# Patient Record
Sex: Male | Born: 2000 | Race: Black or African American | Hispanic: No | Marital: Single | Smoking: Former smoker
Health system: Southern US, Community
[De-identification: ages and names within clinical notes are randomized; demographics above are authoritative.]

---
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
## 2020-03-11 ENCOUNTER — Emergency Department (HOSPITAL_COMMUNITY)
Admission: EM | Admit: 2020-03-11 | Discharge: 2020-03-11 | Disposition: A | Discharge status: Home / Self Care | Payer: Medicaid Other | Attending: Emergency Medicine | Admitting: Emergency Medicine

## 2020-03-11 ENCOUNTER — Other Ambulatory Visit: Payer: Self-pay

## 2020-03-11 DIAGNOSIS — R369 Urethral discharge, unspecified: Secondary | ICD-10-CM | POA: Insufficient documentation

## 2020-03-11 DIAGNOSIS — Z87891 Personal history of nicotine dependence: Secondary | ICD-10-CM | POA: Insufficient documentation

## 2020-03-11 DIAGNOSIS — J45909 Unspecified asthma, uncomplicated: Secondary | ICD-10-CM | POA: Insufficient documentation

## 2020-03-11 LAB — URINALYSIS, ROUTINE W REFLEX MICROSCOPIC
Bilirubin Urine: NEGATIVE
Glucose, UA: NEGATIVE mg/dL
Hgb urine dipstick: NEGATIVE
Ketones, ur: NEGATIVE mg/dL
Nitrite: NEGATIVE
Protein, ur: NEGATIVE mg/dL
Specific Gravity, Urine: 1.026 (ref 1.005–1.030)
WBC, UA: >50 WBC/hpf — ABNORMAL HIGH (ref 0–5)
pH: 6 (ref 5.0–8.0)

## 2020-03-11 MED ORDER — AZITHROMYCIN 1 G PO PACK
1.0000 g | PACK | Freq: Once | ORAL | Status: AC
  Administered 2020-03-11: 1 g via ORAL
  Filled 2020-03-11: qty 1

## 2020-03-11 MED ORDER — STERILE WATER FOR INJECTION IJ SOLN
INTRAMUSCULAR | Status: AC
  Administered 2020-03-11: 10 mL
  Filled 2020-03-11: qty 10

## 2020-03-11 MED ORDER — CEFTRIAXONE SODIUM 500 MG IJ SOLR
500.0000 mg | Freq: Once | INTRAMUSCULAR | Status: AC
  Administered 2020-03-11: 500 mg via INTRAMUSCULAR
  Filled 2020-03-11: qty 500

## 2020-03-11 MED ORDER — DOXYCYCLINE HYCLATE 100 MG PO CAPS: 100.0000 mg | ORAL_CAPSULE | Freq: Two times a day (BID) | ORAL | 0 refills | Status: DC

## 2020-03-11 NOTE — ED Triage Notes (Signed)
Pt noticed clear discharge at the tip of penis & wants eval for STI. Endorses unprotected sex w 1 partner. Also states he feels he's got an ingrown hair that did not heal correctly.

## 2020-03-11 NOTE — ED Provider Notes (Signed)
[facility] EMERGENCY DEPARTMENT Provider Note   CSN: [id number] Arrival date & time: 03/11/20  0357     History Chief Complaint  Patient presents with  . Exposure to STD    Marco Rhodes is a 20 y.o. male.  20 year old male with prior medical history detailed below presents for evaluation of suspected STI.  Patient reports penile drainage noticed 4 days prior.  Patient denies fever.  Patient denies abdominal pain.  Patient denies penile lesion.  He denies prior infection with STI.  He is presenting to the ED with his male partner.  The history is provided by the patient and medical records.  Exposure to STD This is a new problem. The current episode started more than 2 days ago. The problem occurs constantly. The problem has not changed since onset.Pertinent negatives include no chest pain and no abdominal pain. Nothing aggravates the symptoms. Nothing relieves the symptoms.       Past Medical History:  Diagnosis Date  . Asthma     There are no problems to display for this patient.   No past surgical history on file.     No family history on file.  Social History   Tobacco Use  . Smoking status: Former Games developer  . Smokeless tobacco: Never Used  Substance Use Topics  . Drug use: Yes    Types: Marijuana    Comment: Marijuana use daily     Home Medications Prior to Admission medications   Not on File    Allergies    Patient has no known allergies.  Review of Systems   Review of Systems  Cardiovascular: Negative for chest pain.  Gastrointestinal: Negative for abdominal pain.  All other systems reviewed and are negative.   Physical Exam Updated Vital Signs BP 127/88 (BP Location: Left Arm)   Pulse 65   Temp 97.9 F (36.6 C) (Oral)   Resp 17   SpO2 98%   Physical Exam Vitals and nursing note reviewed.  Constitutional:      General: He is not in acute distress.    Appearance: Normal appearance. He is well-developed and  well-nourished.  HENT:     Head: Normocephalic and atraumatic.     Mouth/Throat:     Mouth: Oropharynx is clear and moist.  Eyes:     Extraocular Movements: EOM normal.     Conjunctiva/sclera: Conjunctivae normal.     Pupils: Pupils are equal, round, and reactive to light.  Cardiovascular:     Rate and Rhythm: Normal rate and regular rhythm.     Heart sounds: Normal heart sounds.  Pulmonary:     Effort: Pulmonary effort is normal. No respiratory distress.     Breath sounds: Normal breath sounds.  Abdominal:     General: There is no distension.     Palpations: Abdomen is soft.     Tenderness: There is no abdominal tenderness.  Genitourinary:    Testes: Normal.     Comments: Mild penile discharge noted.  No penile lesions or ulcerations noted. Musculoskeletal:        General: No deformity or edema. Normal range of motion.     Cervical back: Normal range of motion and neck supple.  Skin:    General: Skin is warm and dry.  Neurological:     General: No focal deficit present.     Mental Status: He is alert and oriented to person, place, and time.  Psychiatric:        Mood and Affect:  Mood and affect normal.     ED Results / Procedures / Treatments   Labs (all labs ordered are listed, but only abnormal results are displayed) Labs Reviewed  URINALYSIS, ROUTINE W REFLEX MICROSCOPIC - Abnormal; Notable for the following components:      Result Value   Leukocytes,Ua SMALL (*)    WBC, UA >50 (*)    Bacteria, UA FEW (*)    All other components within normal limits  GC/CHLAMYDIA PROBE AMP ([place]) NOT AT [facility]    EKG None  Radiology No results found.  Procedures Procedures   Medications Ordered in ED Medications  cefTRIAXone (ROCEPHIN) injection 500 mg (has no administration in time range)  azithromycin (ZITHROMAX) powder 1 g (has no administration in time range)    ED Course  I have reviewed the triage vital signs and the nursing notes.  Pertinent labs &  imaging results that were available during my care of the patient were reviewed by me and considered in my medical decision making (see chart for details).    MDM Rules/Calculators/A&P                           MDM  Screen complete  Marco Rhodes was evaluated in Emergency Department on 03/11/2020 for the symptoms described in the history of present illness. He was evaluated in the context of the global COVID-19 pandemic, which necessitated consideration that the patient might be at risk for infection with the SARS-CoV-2 virus that causes COVID-19. Institutional protocols and algorithms that pertain to the evaluation of patients at risk for COVID-19 are in a state of rapid change based on information released by regulatory bodies including the CDC and federal and state organizations. These policies and algorithms were followed during the patient's care in the ED.   Patient is presenting for suspected STI.  Exam is suggestive of urethritis  Patient will be treated empirically.  His partner will also be treated empirically.  Cultures pending.  Patient is advised to closely follow-up with health department or PMD for additional STI screening and evaluation.  Patient is also advised to contact all partners regarding his suspected diagnosis. Final Clinical Impression(s) / ED Diagnoses Final diagnoses:  Penile discharge    Rx / DC Orders ED Discharge Orders         Ordered    doxycycline (VIBRAMYCIN) 100 MG capsule  2 times daily        03/11/20 0833           Wynetta Fines, MD 03/11/20 [phone]

## 2020-03-11 NOTE — Discharge Instructions (Signed)
Please return for any problem.  °

## 2020-03-11 NOTE — ED Triage Notes (Signed)
Pt denies additional signs/symptoms of STI exposure, denies urinary symptoms

## 2020-03-12 LAB — GC/CHLAMYDIA PROBE AMP (~~LOC~~) NOT AT ARMC
Chlamydia: POSITIVE — AB
Comment: NEGATIVE
Comment: NORMAL
Neisseria Gonorrhea: NEGATIVE

## 2020-04-16 ENCOUNTER — Other Ambulatory Visit: Payer: Self-pay

## 2020-04-16 ENCOUNTER — Ambulatory Visit: Payer: Medicaid Other | Attending: Nurse Practitioner | Admitting: Internal Medicine

## 2020-04-16 DIAGNOSIS — Z538 Procedure and treatment not carried out for other reasons: Secondary | ICD-10-CM

## 2020-04-16 NOTE — Progress Notes (Signed)
Patient was scheduled for telephone visit today.  Phone call placed to patient at 3:39 PM.  I got his voicemail.  I did not leave a message.  I tried calling him back about 4 minutes later and no answer.

## 2020-04-21 ENCOUNTER — Inpatient Hospital Stay: Payer: Medicaid Other | Admitting: Nurse Practitioner

## 2020-08-04 ENCOUNTER — Ambulatory Visit: Payer: Self-pay | Admitting: Nurse Practitioner

## 2020-12-02 ENCOUNTER — Emergency Department (HOSPITAL_COMMUNITY): Payer: Medicaid Other

## 2020-12-02 ENCOUNTER — Other Ambulatory Visit: Payer: Self-pay

## 2020-12-02 ENCOUNTER — Emergency Department (HOSPITAL_COMMUNITY)
Admission: EM | Admit: 2020-12-02 | Discharge: 2020-12-02 | Disposition: A | Discharge status: Home / Self Care | Payer: Medicaid Other | Attending: Emergency Medicine | Admitting: Emergency Medicine

## 2020-12-02 ENCOUNTER — Encounter (HOSPITAL_COMMUNITY): Payer: Self-pay | Admitting: Emergency Medicine

## 2020-12-02 ENCOUNTER — Encounter (HOSPITAL_COMMUNITY): Payer: Self-pay

## 2020-12-02 ENCOUNTER — Ambulatory Visit (HOSPITAL_COMMUNITY)
Admission: EM | Admit: 2020-12-02 | Discharge: 2020-12-02 | Disposition: A | Discharge status: Home / Self Care | Payer: Medicaid Other | Attending: Family Medicine | Admitting: Family Medicine

## 2020-12-02 DIAGNOSIS — S42122A Displaced fracture of acromial process, left shoulder, initial encounter for closed fracture: Secondary | ICD-10-CM | POA: Diagnosis not present

## 2020-12-02 DIAGNOSIS — Z23 Encounter for immunization: Secondary | ICD-10-CM | POA: Insufficient documentation

## 2020-12-02 DIAGNOSIS — Y92838 Other recreation area as the place of occurrence of the external cause: Secondary | ICD-10-CM | POA: Insufficient documentation

## 2020-12-02 DIAGNOSIS — S0181XA Laceration without foreign body of other part of head, initial encounter: Secondary | ICD-10-CM | POA: Insufficient documentation

## 2020-12-02 DIAGNOSIS — S42125B Nondisplaced fracture of acromial process, left shoulder, initial encounter for open fracture: Secondary | ICD-10-CM

## 2020-12-02 DIAGNOSIS — Y9302 Activity, running: Secondary | ICD-10-CM | POA: Diagnosis not present

## 2020-12-02 DIAGNOSIS — R000 Tachycardia, unspecified: Secondary | ICD-10-CM | POA: Diagnosis not present

## 2020-12-02 DIAGNOSIS — W3400XA Accidental discharge from unspecified firearms or gun, initial encounter: Secondary | ICD-10-CM | POA: Insufficient documentation

## 2020-12-02 DIAGNOSIS — S4992XA Unspecified injury of left shoulder and upper arm, initial encounter: Secondary | ICD-10-CM | POA: Diagnosis present

## 2020-12-02 DIAGNOSIS — Z87891 Personal history of nicotine dependence: Secondary | ICD-10-CM | POA: Diagnosis not present

## 2020-12-02 DIAGNOSIS — J45909 Unspecified asthma, uncomplicated: Secondary | ICD-10-CM | POA: Insufficient documentation

## 2020-12-02 LAB — COMPREHENSIVE METABOLIC PANEL
ALT: 22 U/L (ref 0–44)
AST: 21 U/L (ref 15–41)
Albumin: 4.9 g/dL (ref 3.5–5.0)
Alkaline Phosphatase: 74 U/L (ref 38–126)
Anion gap: 13 (ref 5–15)
BUN: 9 mg/dL (ref 6–20)
CO2: 24 mmol/L (ref 22–32)
Calcium: 9.8 mg/dL (ref 8.9–10.3)
Chloride: 104 mmol/L (ref 98–111)
Creatinine, Ser: 1.05 mg/dL (ref 0.61–1.24)
GFR, Estimated: >60 mL/min (ref >60)
Glucose, Bld: 124 mg/dL — ABNORMAL HIGH (ref 70–99)
Potassium: 3.6 mmol/L (ref 3.5–5.1)
Sodium: 141 mmol/L (ref 135–145)
Total Bilirubin: 0.6 mg/dL (ref 0.3–1.2)
Total Protein: 8.5 g/dL — ABNORMAL HIGH (ref 6.5–8.1)

## 2020-12-02 LAB — CBC
HCT: 51.7 % (ref 39.0–52.0)
Hemoglobin: 16.7 g/dL (ref 13.0–17.0)
MCH: 28 pg (ref 26.0–34.0)
MCHC: 32.3 g/dL (ref 30.0–36.0)
MCV: 86.6 fL (ref 80.0–100.0)
Platelets: 342 10*3/uL (ref 150–400)
RBC: 5.97 MIL/uL — ABNORMAL HIGH (ref 4.22–5.81)
RDW: 12.9 % (ref 11.5–15.5)
WBC: 11.8 10*3/uL — ABNORMAL HIGH (ref 4.0–10.5)
nRBC: 0 % (ref 0.0–0.2)

## 2020-12-02 LAB — URINALYSIS, ROUTINE W REFLEX MICROSCOPIC
Bilirubin Urine: NEGATIVE
Glucose, UA: NEGATIVE mg/dL
Hgb urine dipstick: NEGATIVE
Ketones, ur: NEGATIVE mg/dL
Leukocytes,Ua: NEGATIVE
Nitrite: NEGATIVE
Protein, ur: NEGATIVE mg/dL
Specific Gravity, Urine: 1.014 (ref 1.005–1.030)
pH: 6 (ref 5.0–8.0)

## 2020-12-02 LAB — LACTIC ACID, PLASMA
Lactic Acid, Venous: 4.6 mmol/L (ref 0.5–1.9)
Lactic Acid, Venous: 2 mmol/L (ref 0.5–1.9)

## 2020-12-02 LAB — I-STAT CHEM 8, ED
BUN: 8 mg/dL (ref 6–20)
Calcium, Ion: 1.15 mmol/L (ref 1.15–1.40)
Chloride: 104 mmol/L (ref 98–111)
Creatinine, Ser: 1.5 mg/dL — ABNORMAL HIGH (ref 0.61–1.24)
Glucose, Bld: 122 mg/dL — ABNORMAL HIGH (ref 70–99)
HCT: 53 % — ABNORMAL HIGH (ref 39.0–52.0)
Hemoglobin: 18 g/dL — ABNORMAL HIGH (ref 13.0–17.0)
Potassium: 3.6 mmol/L (ref 3.5–5.1)
Sodium: 143 mmol/L (ref 135–145)
TCO2: 23 mmol/L (ref 22–32)

## 2020-12-02 LAB — PROTIME-INR
INR: 0.9 (ref 0.8–1.2)
Prothrombin Time: 12.6 seconds (ref 11.4–15.2)

## 2020-12-02 LAB — SAMPLE TO BLOOD BANK

## 2020-12-02 LAB — ETHANOL: Alcohol, Ethyl (B): 232 mg/dL — ABNORMAL HIGH (ref <10)

## 2020-12-02 MED ORDER — BACITRACIN ZINC 500 UNIT/GM EX OINT: 1.0000 "application " | TOPICAL_OINTMENT | Freq: Two times a day (BID) | CUTANEOUS | 0 refills | Status: DC

## 2020-12-02 MED ORDER — HYDROMORPHONE HCL 1 MG/ML IJ SOLN
1.0000 mg | Freq: Once | INTRAMUSCULAR | Status: AC
  Administered 2020-12-02: 1 mg via INTRAVENOUS
  Filled 2020-12-02: qty 1

## 2020-12-02 MED ORDER — CEFAZOLIN SODIUM-DEXTROSE 2-4 GM/100ML-% IV SOLN
2.0000 g | Freq: Once | INTRAVENOUS | Status: AC
  Administered 2020-12-02: 2 g via INTRAVENOUS
  Filled 2020-12-02: qty 100

## 2020-12-02 MED ORDER — TETANUS-DIPHTH-ACELL PERTUSSIS 5-2.5-18.5 LF-MCG/0.5 IM SUSY
0.5000 mL | PREFILLED_SYRINGE | Freq: Once | INTRAMUSCULAR | Status: AC
  Administered 2020-12-02: 0.5 mL via INTRAMUSCULAR
  Filled 2020-12-02: qty 0.5

## 2020-12-02 MED ORDER — SODIUM CHLORIDE 0.9 % IV SOLN: INTRAVENOUS | Status: DC

## 2020-12-02 MED ORDER — LIDOCAINE-EPINEPHRINE (PF) 2 %-1:200000 IJ SOLN
20.0000 mL | Freq: Once | INTRAMUSCULAR | Status: AC
  Administered 2020-12-02: 20 mL
  Filled 2020-12-02: qty 20

## 2020-12-02 MED ORDER — SODIUM CHLORIDE 0.9 % IV BOLUS
1000.0000 mL | Freq: Once | INTRAVENOUS | Status: AC
  Administered 2020-12-02: 1000 mL via INTRAVENOUS

## 2020-12-02 MED ORDER — HYDROCODONE-ACETAMINOPHEN 5-325 MG PO TABS: 1.0000 | ORAL_TABLET | Freq: Four times a day (QID) | ORAL | 0 refills | Status: DC | PRN

## 2020-12-02 MED ORDER — CEPHALEXIN 500 MG PO CAPS: 500.0000 mg | ORAL_CAPSULE | Freq: Four times a day (QID) | ORAL | 0 refills | Status: DC

## 2020-12-02 MED ORDER — IOHEXOL 350 MG/ML SOLN
100.0000 mL | Freq: Once | INTRAVENOUS | Status: AC | PRN
  Administered 2020-12-02: 100 mL via INTRAVENOUS

## 2020-12-02 MED ORDER — POVIDONE-IODINE 5 % EX SOLN
Freq: Once | CUTANEOUS | Status: DC
  Filled 2020-12-02: qty 88.7

## 2020-12-02 MED ORDER — BACITRACIN ZINC 500 UNIT/GM EX OINT
TOPICAL_OINTMENT | Freq: Two times a day (BID) | CUTANEOUS | Status: DC
Start: 2020-12-02 — End: 2020-12-02

## 2020-12-02 NOTE — ED Triage Notes (Signed)
Patient is requesting pain medicine and work note after being discharged from [facility]

## 2020-12-02 NOTE — ED Notes (Signed)
Patient refused COVID swab
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
## 2020-12-02 NOTE — Discharge Instructions (Addendum)

## 2020-12-02 NOTE — Discharge Instructions (Addendum)
Use a topical ointment twice daily.  Take the antibiotics as prescribed.  You should follow-up with your primary doctor or urgent care for suture removal in 1 week. Follow-up with the orthopedic doctor regarding the broken bone in your shoulder. Wear the sling. Return to the ED with new or worsening symptoms

## 2020-12-03 ENCOUNTER — Telehealth (HOSPITAL_COMMUNITY): Payer: Self-pay | Admitting: Physician Assistant

## 2020-12-03 MED ORDER — HYDROCODONE-ACETAMINOPHEN 5-325 MG PO TABS
1.0000 | ORAL_TABLET | Freq: Four times a day (QID) | ORAL | 0 refills | Status: DC | PRN
Start: 2020-12-03 — End: 2021-08-10

## 2020-12-03 NOTE — Telephone Encounter (Signed)
Received call stating pain medication had not been received by pharmacy. Contacted pharmacy to verify. States no record of that patient. Contacted pt to verify RX was not printed with discharge papers. Provider aware to resend rx to pharmacy.
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
## 2020-12-17 ENCOUNTER — Emergency Department (HOSPITAL_COMMUNITY)
Admission: EM | Admit: 2020-12-17 | Discharge: 2020-12-17 | Disposition: A | Discharge status: Home / Self Care | Payer: Medicaid Other | Attending: Emergency Medicine | Admitting: Emergency Medicine

## 2020-12-17 ENCOUNTER — Other Ambulatory Visit: Payer: Self-pay

## 2020-12-17 ENCOUNTER — Encounter (HOSPITAL_COMMUNITY): Payer: Self-pay | Admitting: *Deleted

## 2020-12-17 DIAGNOSIS — Z20822 Contact with and (suspected) exposure to covid-19: Secondary | ICD-10-CM | POA: Diagnosis not present

## 2020-12-17 DIAGNOSIS — Z87891 Personal history of nicotine dependence: Secondary | ICD-10-CM | POA: Insufficient documentation

## 2020-12-17 DIAGNOSIS — J45909 Unspecified asthma, uncomplicated: Secondary | ICD-10-CM | POA: Diagnosis not present

## 2020-12-17 DIAGNOSIS — J029 Acute pharyngitis, unspecified: Secondary | ICD-10-CM | POA: Insufficient documentation

## 2020-12-17 LAB — GROUP A STREP BY PCR: Group A Strep by PCR: NOT DETECTED

## 2020-12-17 LAB — RESP PANEL BY RT-PCR (FLU A&B, COVID) ARPGX2
Influenza A by PCR: NEGATIVE
Influenza B by PCR: NEGATIVE
SARS Coronavirus 2 by RT PCR: NEGATIVE

## 2020-12-17 MED ORDER — DEXAMETHASONE 4 MG PO TABS
10.0000 mg | ORAL_TABLET | Freq: Once | ORAL | Status: AC
  Administered 2020-12-17: 10 mg via ORAL
  Filled 2020-12-17: qty 3

## 2020-12-17 MED ORDER — LIDOCAINE VISCOUS HCL 2 % MT SOLN: 15.0000 mL | OROMUCOSAL | 0 refills | Status: DC | PRN

## 2020-12-17 NOTE — Discharge Instructions (Signed)
If you develop high fever, severe cough or cough with blood, trouble breathing, severe headache, neck pain/stiffness, vomiting, or any other new/concerning symptoms then return to the ER for evaluation  

## 2020-12-17 NOTE — ED Provider Notes (Signed)
[facility] EMERGENCY DEPARTMENT Provider Note   CSN: [id number] Arrival date & time: 12/17/20  1555     History Chief Complaint  Patient presents with   Sore Throat    Marco Rhodes is a 20 y.o. male.  HPI 20 year old male presents with sore throat.  Started 2 days ago.  He has been taking ibuprofen which temporarily relieves the discomfort.  He took some prior to arrival so he actually has no pain right now.  He has not had a fever.  He has a chronic cough that is unchanged.  No shortness of breath, neck pain, vomiting.  He was around a friend who had similar sore throat a few days ago.  He denies difficulty swallowing besides pain but he is able to swallow.  Past Medical History:  Diagnosis Date   Asthma     There are no problems to display for this patient.   History reviewed. No pertinent surgical history.     Family History  Problem Relation Age of Onset   Diabetes Mother     Social History   Tobacco Use   Smoking status: Former    Types: Cigars   Smokeless tobacco: Never  Vaping Use   Vaping Use: Never used  Substance Use Topics   Alcohol use: Never   Drug use: Yes    Types: Marijuana    Comment: Marijuana use daily     Home Medications Prior to Admission medications   Medication Sig Start Date End Date Taking? Authorizing Provider  lidocaine (XYLOCAINE) 2 % solution Use as directed 15 mLs in the mouth or throat every 4 (four) hours as needed for mouth pain. 12/17/20  Yes Pricilla Loveless, MD  bacitracin ointment Apply 1 application topically 2 (two) times daily. 12/02/20   Rancour, Jeannett Senior, MD  cephALEXin (KEFLEX) 500 MG capsule Take 1 capsule (500 mg total) by mouth 4 (four) times daily. 12/02/20   Rancour, Jeannett Senior, MD  doxycycline (VIBRAMYCIN) 100 MG capsule Take 1 capsule (100 mg total) by mouth 2 (two) times daily. Patient not taking: Reported on 04/16/2020 03/11/20   Wynetta Fines, MD  HYDROcodone-acetaminophen (NORCO/VICODIN)  5-325 MG tablet Take 1 tablet by mouth every 6 (six) hours as needed for moderate pain or severe pain. 12/03/20   Raspet, Noberto Retort, PA-C    Allergies    Patient has no known allergies.  Review of Systems   Review of Systems  Constitutional:  Negative for fever.  HENT:  Positive for sore throat.   Respiratory:  Negative for cough and shortness of breath.   Gastrointestinal:  Negative for vomiting.  Musculoskeletal:  Negative for neck pain.  All other systems reviewed and are negative.  Physical Exam Updated Vital Signs BP 133/89 (BP Location: Right Arm)   Pulse 88   Temp 98.6 F (37 C)   Resp 20   Ht 6\' 1"  (1.854 m)   Wt 79.4 kg   SpO2 99%   BMI 23.09 kg/m   Physical Exam Vitals and nursing note reviewed.  Constitutional:      Appearance: He is well-developed.  HENT:     Head: Normocephalic and atraumatic.     Right Ear: External ear normal.     Left Ear: External ear normal.     Nose: Nose normal.     Mouth/Throat:     Tonsils: No tonsillar exudate or tonsillar abscesses. 3+ on the right. 3+ on the left.  Eyes:     General:  Right eye: No discharge.        Left eye: No discharge.  Cardiovascular:     Rate and Rhythm: Normal rate and regular rhythm.     Heart sounds: Normal heart sounds.  Pulmonary:     Effort: Pulmonary effort is normal.     Breath sounds: Normal breath sounds.  Abdominal:     Palpations: Abdomen is soft.     Tenderness: There is no abdominal tenderness.  Musculoskeletal:     Cervical back: Neck supple.  Skin:    General: Skin is warm and dry.  Neurological:     Mental Status: He is alert.  Psychiatric:        Mood and Affect: Mood is not anxious.    ED Results / Procedures / Treatments   Labs (all labs ordered are listed, but only abnormal results are displayed) Labs Reviewed  GROUP A STREP BY PCR  RESP PANEL BY RT-PCR (FLU A&B, COVID) [id number]    EKG None  Radiology No results found.  Procedures Procedures    Medications Ordered in ED Medications  dexamethasone (DECADRON) tablet 10 mg (10 mg Oral Given 12/17/20 1647)    ED Course  I have reviewed the triage vital signs and the nursing notes.  Pertinent labs & imaging results that were available during my care of the patient were reviewed by me and considered in my medical decision making (see chart for details).    MDM Rules/Calculators/A&P                           Patient's COVID and strep and flu testing are all negative.  This is likely a viral pharyngitis.  No indication for antibiotics.  He does not have tonsillar abscess and I have low suspicion for deep space neck infection.  At this point, he was given some Decadron and he appears stable for supportive care as an outpatient.  We discussed return precautions. Final Clinical Impression(s) / ED Diagnoses Final diagnoses:  Viral pharyngitis    Rx / DC Orders ED Discharge Orders          Ordered    lidocaine (XYLOCAINE) 2 % solution  Every 4 hours PRN        12/17/20 1801             Pricilla Loveless, MD 12/17/20 1802

## 2020-12-17 NOTE — ED Triage Notes (Signed)
Pt states sore throat and swollen tonsils x 4 days.  Has been taking up to 1600 mg ibuprofen to help with pain.  Last took ibuprofen at 1530.

## 2021-01-05 ENCOUNTER — Ambulatory Visit: Payer: Self-pay | Admitting: Nurse Practitioner

## 2021-01-14 ENCOUNTER — Ambulatory Visit: Payer: Medicaid Other | Admitting: Family Medicine

## 2021-01-19 ENCOUNTER — Emergency Department (HOSPITAL_COMMUNITY)
Admission: EM | Admit: 2021-01-19 | Discharge: 2021-01-19 | Disposition: A | Discharge status: Home / Self Care | Payer: Medicaid Other | Attending: Emergency Medicine | Admitting: Emergency Medicine

## 2021-01-19 ENCOUNTER — Encounter (HOSPITAL_COMMUNITY): Payer: Self-pay

## 2021-01-19 ENCOUNTER — Other Ambulatory Visit: Payer: Self-pay

## 2021-01-19 ENCOUNTER — Ambulatory Visit (HOSPITAL_COMMUNITY)
Admission: EM | Admit: 2021-01-19 | Discharge: 2021-01-19 | Disposition: A | Discharge status: Home / Self Care | Payer: Medicaid Other | Attending: Physician Assistant | Admitting: Physician Assistant

## 2021-01-19 DIAGNOSIS — Z711 Person with feared health complaint in whom no diagnosis is made: Secondary | ICD-10-CM | POA: Insufficient documentation

## 2021-01-19 DIAGNOSIS — Z5321 Procedure and treatment not carried out due to patient leaving prior to being seen by health care provider: Secondary | ICD-10-CM | POA: Insufficient documentation

## 2021-01-19 DIAGNOSIS — R369 Urethral discharge, unspecified: Secondary | ICD-10-CM | POA: Diagnosis present

## 2021-01-19 DIAGNOSIS — Z113 Encounter for screening for infections with a predominantly sexual mode of transmission: Secondary | ICD-10-CM | POA: Diagnosis not present

## 2021-01-19 LAB — POCT URINALYSIS DIPSTICK, ED / UC
Bilirubin Urine: NEGATIVE
Glucose, UA: NEGATIVE mg/dL
Ketones, ur: 80 mg/dL — AB
Leukocytes,Ua: NEGATIVE
Nitrite: NEGATIVE
Protein, ur: NEGATIVE mg/dL
Specific Gravity, Urine: >=1.03 (ref 1.005–1.030)
Urobilinogen, UA: 0.2 mg/dL (ref 0.0–1.0)
pH: 6 (ref 5.0–8.0)

## 2021-01-19 MED ORDER — LIDOCAINE HCL (PF) 1 % IJ SOLN
INTRAMUSCULAR | Status: AC
  Filled 2021-01-19: qty 2

## 2021-01-19 MED ORDER — CEFTRIAXONE SODIUM 500 MG IJ SOLR
500.0000 mg | Freq: Once | INTRAMUSCULAR | Status: AC
  Administered 2021-01-19: 15:00:00 500 mg via INTRAMUSCULAR

## 2021-01-19 MED ORDER — CEFTRIAXONE SODIUM 500 MG IJ SOLR
INTRAMUSCULAR | Status: AC
  Filled 2021-01-19: qty 500

## 2021-01-19 MED ORDER — DOXYCYCLINE HYCLATE 100 MG PO CAPS: 100.0000 mg | ORAL_CAPSULE | Freq: Two times a day (BID) | ORAL | 0 refills | Status: AC

## 2021-01-19 MED ORDER — DOXYCYCLINE HYCLATE 100 MG PO CAPS: 100.0000 mg | ORAL_CAPSULE | Freq: Two times a day (BID) | ORAL | 0 refills | Status: DC

## 2021-01-19 NOTE — Discharge Instructions (Signed)
I am concerned that you have a sexually transmitted infection.  You were treated for gonorrhea today and I have sent in the treatment for chlamydia (doxycycline 100 mg twice daily for 7 days) to your pharmacy.  You need to abstain from sex for 7 days after completing treatment (minimum of 2 weeks).  All partners need to be tested and treated.  Is important use condoms with each sexual encounter.  If you develop any worsening symptoms including fever, nausea, vomiting, persistent symptoms you need to be seen again.
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
## 2021-01-19 NOTE — ED Triage Notes (Signed)
Pt presents with penile discharge x 2 days.  Presents for STD testing.

## 2021-01-19 NOTE — ED Notes (Signed)
Left before triage

## 2021-01-20 LAB — CYTOLOGY, (ORAL, ANAL, URETHRAL) ANCILLARY ONLY
Chlamydia: POSITIVE — AB
Comment: NEGATIVE
Comment: NEGATIVE
Comment: NORMAL
Neisseria Gonorrhea: NEGATIVE
Trichomonas: POSITIVE — AB

## 2021-01-21 ENCOUNTER — Ambulatory Visit (HOSPITAL_COMMUNITY)
Admission: EM | Admit: 2021-01-21 | Discharge: 2021-01-21 | Disposition: A | Discharge status: Home / Self Care | Payer: Medicaid Other

## 2021-01-21 ENCOUNTER — Telehealth (HOSPITAL_COMMUNITY): Payer: Self-pay | Admitting: Emergency Medicine

## 2021-01-21 ENCOUNTER — Other Ambulatory Visit: Payer: Self-pay

## 2021-01-21 MED ORDER — METRONIDAZOLE 500 MG PO TABS: 2000.0000 mg | ORAL_TABLET | Freq: Once | ORAL | 0 refills | Status: AC

## 2021-01-21 MED ORDER — METRONIDAZOLE 500 MG PO TABS: 2000.0000 mg | ORAL_TABLET | Freq: Once | ORAL | 0 refills | Status: DC

## 2021-01-21 NOTE — Telephone Encounter (Signed)
Patient wanted prescription sent to a different pharmacy 

## 2021-02-15 ENCOUNTER — Ambulatory Visit: Payer: Medicaid Other | Admitting: Family

## 2021-03-17 ENCOUNTER — Ambulatory Visit (HOSPITAL_COMMUNITY)
Admission: RE | Admit: 2021-03-17 | Discharge: 2021-03-17 | Disposition: A | Discharge status: Home / Self Care | Payer: Medicaid Other | Source: Ambulatory Visit | Attending: Emergency Medicine | Admitting: Emergency Medicine

## 2021-03-17 ENCOUNTER — Encounter (HOSPITAL_COMMUNITY): Payer: Self-pay

## 2021-03-17 ENCOUNTER — Other Ambulatory Visit: Payer: Self-pay

## 2021-03-17 VITALS — BP 137/86 | HR 69 | Temp 98.4°F | Resp 16 | Ht 73.0 in | Wt 175.0 lb

## 2021-03-17 DIAGNOSIS — R369 Urethral discharge, unspecified: Secondary | ICD-10-CM | POA: Insufficient documentation

## 2021-03-17 LAB — HIV ANTIBODY (ROUTINE TESTING W REFLEX): HIV Screen 4th Generation wRfx: NONREACTIVE

## 2021-03-17 NOTE — Discharge Instructions (Addendum)
You will get a call if tests are positive, you will not get a call if tests are negative but you can check results in MyChart if you have a MyChart account.   

## 2021-03-17 NOTE — ED Provider Notes (Signed)
[facility]    CSN: [id number] Arrival date & time: 03/17/21  1244      History   Chief Complaint Chief Complaint  Patient presents with   Exposure to STD    HPI Marco Rhodes is a 21 y.o. male. Pt reports clear penile discharge after voiding x 3 days. Reports urethra is irritated. Has had similar sx in the past when had chlamydia. Reports is using condoms but one broke. Wants STI testing.    Exposure to STD Pertinent negatives include no abdominal pain.   Past Medical History:  Diagnosis Date   Asthma     There are no problems to display for this patient.   History reviewed. No pertinent surgical history.     Home Medications    Prior to Admission medications   Medication Sig Start Date End Date Taking? Authorizing Provider  bacitracin ointment Apply 1 application topically 2 (two) times daily. 12/02/20   Rancour, Annie Main, MD  cephALEXin (KEFLEX) 500 MG capsule Take 1 capsule (500 mg total) by mouth 4 (four) times daily. 12/02/20   Rancour, Annie Main, MD  HYDROcodone-acetaminophen (NORCO/VICODIN) 5-325 MG tablet Take 1 tablet by mouth every 6 (six) hours as needed for moderate pain or severe pain. 12/03/20   Raspet, Junie Panning K, PA-C  lidocaine (XYLOCAINE) 2 % solution Use as directed 15 mLs in the mouth or throat every 4 (four) hours as needed for mouth pain. 12/17/20   Sherwood Gambler, MD    Family History Family History  Problem Relation Age of Onset   Diabetes Mother     Social History Social History   Tobacco Use   Smoking status: Former    Types: Cigars   Smokeless tobacco: Never  Scientific laboratory technician Use: Never used  Substance Use Topics   Alcohol use: Never   Drug use: Yes    Types: Marijuana    Comment: Marijuana use daily      Allergies   Patient has no known allergies.   Review of Systems Review of Systems  Constitutional:  Negative for chills and fever.  Gastrointestinal:  Negative for abdominal pain, nausea and vomiting.   Genitourinary:  Positive for penile discharge. Negative for dysuria, flank pain and hematuria.    Physical Exam Triage Vital Signs ED Triage Vitals  Enc Vitals Group     BP 03/17/21 1303 137/86     Pulse Rate 03/17/21 1303 69     Resp 03/17/21 1303 16     Temp 03/17/21 1303 98.4 F (36.9 C)     Temp Source 03/17/21 1303 Oral     SpO2 03/17/21 1302 97 %     Weight 03/17/21 1302 175 lb 0.7 oz (79.4 kg)     Height 03/17/21 1302 6\' 1"  (1.854 m)     Head Circumference --      Peak Flow --      Pain Score 03/17/21 1302 0     Pain Loc --      Pain Edu? --      Excl. in [place]? --    No data found.  Updated Vital Signs BP 137/86 (BP Location: Right Arm)    Pulse 69    Temp 98.4 F (36.9 C) (Oral)    Resp 16    Ht 6\' 1"  (1.854 m)    Wt 175 lb 0.7 oz (79.4 kg)    SpO2 97%    BMI 23.09 kg/m   Visual Acuity Right Eye Distance:   Left  Eye Distance:   Bilateral Distance:    Right Eye Near:   Left Eye Near:    Bilateral Near:     Physical Exam   UC Treatments / Results  Labs (all labs ordered are listed, but only abnormal results are displayed) Labs Reviewed  HIV ANTIBODY (ROUTINE TESTING W REFLEX)  RPR  CYTOLOGY, (ORAL, ANAL, URETHRAL) ANCILLARY ONLY    EKG   Radiology No results found.  Procedures Procedures (including critical care time)  Medications Ordered in UC Medications - No data to display  Initial Impression / Assessment and Plan / UC Course  I have reviewed the triage vital signs and the nursing notes.  Pertinent labs & imaging results that were available during my care of the patient were reviewed by me and considered in my medical decision making (see chart for details).    STI testing results pending. Discussed safe sex practices.   Final Clinical Impressions(s) / UC Diagnoses   Final diagnoses:  Penile discharge     Discharge Instructions      You will get a call if tests are positive, you will not get a call if tests are negative but  you can check results in MyChart if you have a MyChart account.     ED Prescriptions   None    PDMP not reviewed this encounter.   Carvel Getting, NP 03/17/21 1347

## 2021-03-17 NOTE — ED Triage Notes (Signed)
Pt reports penile discharge x 3 days.  Requesting STD testing

## 2021-03-18 LAB — CYTOLOGY, (ORAL, ANAL, URETHRAL) ANCILLARY ONLY
Chlamydia: NEGATIVE
Comment: NEGATIVE
Comment: NEGATIVE
Comment: NORMAL
Neisseria Gonorrhea: NEGATIVE
Trichomonas: POSITIVE — AB

## 2021-03-18 LAB — RPR: RPR Ser Ql: NONREACTIVE

## 2021-03-19 ENCOUNTER — Telehealth (HOSPITAL_COMMUNITY): Payer: Self-pay | Admitting: Emergency Medicine

## 2021-03-19 MED ORDER — METRONIDAZOLE 500 MG PO TABS: 2000.0000 mg | ORAL_TABLET | Freq: Once | ORAL | 0 refills | Status: AC

## 2021-03-20 ENCOUNTER — Encounter: Payer: Self-pay | Admitting: Internal Medicine

## 2021-05-10 NOTE — Progress Notes (Signed)
   Complete physical exam  Patient: Marco Rhodes   DOB: 11/13/1998   21 y.o. Male  MRN: [id number]  Subjective:    No chief complaint on file.   Marco Rhodes is a 21 y.o. male who presents today for a complete physical exam. She reports consuming a {diet types:17450} diet. {types:19826} She generally feels {DESC; WELL/FAIRLY WELL/POORLY:18703}. She reports sleeping {DESC; WELL/FAIRLY WELL/POORLY:18703}. She {does/does not:200015} have additional problems to discuss today.    Most recent fall risk assessment:    07/21/2021   10:42 AM  Fall Risk   Falls in the past year? 0  Number falls in past yr: 0  Injury with Fall? 0  Risk for fall due to : No Fall Risks  Follow up Falls evaluation completed     Most recent depression screenings:    07/21/2021   10:42 AM 06/11/2020   10:46 AM  PHQ 2/9 Scores  PHQ - 2 Score 0 0  PHQ- 9 Score 5     {VISON DENTAL STD PSA (Optional):27386}  {History (Optional):23778}  Patient Care Team: Tocara Mennen, NP as PCP - General (Nurse Practitioner)   Outpatient Medications Prior to Visit  Medication Sig   fluticasone (FLONASE) 50 MCG/ACT nasal spray Place 2 sprays into both nostrils in the morning and at bedtime. After 7 days, reduce to once daily.   norgestimate-ethinyl estradiol (SPRINTEC 28) 0.25-35 MG-MCG tablet Take 1 tablet by mouth daily.   Nystatin POWD Apply liberally to affected area 2 times per day   spironolactone (ALDACTONE) 100 MG tablet Take 1 tablet (100 mg total) by mouth daily.   No facility-administered medications prior to visit.    ROS        Objective:     There were no vitals taken for this visit. {Vitals History (Optional):23777}  Physical Exam   No results found for any visits on 08/26/21. {Show previous labs (optional):23779}    Assessment & Plan:    Routine Health Maintenance and Physical Exam  Immunization History  Administered Date(s) Administered   DTaP 01/27/1999, 03/25/1999,  06/03/1999, 02/17/2000, 09/02/2003   Hepatitis A 06/29/2007, 07/04/2008   Hepatitis B 11/14/1998, 12/22/1998, 06/03/1999   HiB (PRP-OMP) 01/27/1999, 03/25/1999, 06/03/1999, 02/17/2000   IPV 01/27/1999, 03/25/1999, 11/22/1999, 09/02/2003   Influenza,inj,Quad PF,6+ Mos 10/04/2013   Influenza-Unspecified 01/04/2012   MMR 11/21/2000, 09/02/2003   Meningococcal Polysaccharide 07/04/2011   Pneumococcal Conjugate-13 02/17/2000   Pneumococcal-Unspecified 06/03/1999, 08/17/1999   Tdap 07/04/2011   Varicella 11/22/1999, 06/29/2007    Health Maintenance  Topic Date Due   HIV Screening  Never done   Hepatitis C Screening  Never done   INFLUENZA VACCINE  08/24/2021   PAP-Cervical Cytology Screening  08/26/2021 (Originally 11/13/2019)   PAP SMEAR-Modifier  08/26/2021 (Originally 11/13/2019)   TETANUS/TDAP  08/26/2021 (Originally 07/03/2021)   HPV VACCINES  Discontinued   COVID-19 Vaccine  Discontinued    Discussed health benefits of physical activity, and encouraged her to engage in regular exercise appropriate for her age and condition.  Problem List Items Addressed This Visit   None Visit Diagnoses     Annual physical exam    -  Primary   Cervical cancer screening       Need for Tdap vaccination          No follow-ups on file.     Caitlinn Klinker, NP   

## 2021-05-11 ENCOUNTER — Ambulatory Visit (INDEPENDENT_AMBULATORY_CARE_PROVIDER_SITE_OTHER): Payer: Medicaid Other | Admitting: Medical-Surgical

## 2021-05-11 DIAGNOSIS — Z7689 Persons encountering health services in other specified circumstances: Secondary | ICD-10-CM

## 2021-05-11 DIAGNOSIS — Z91199 Patient's noncompliance with other medical treatment and regimen due to unspecified reason: Secondary | ICD-10-CM

## 2021-08-09 ENCOUNTER — Ambulatory Visit: Payer: Medicaid Other | Admitting: Family Medicine

## 2021-08-10 ENCOUNTER — Ambulatory Visit (HOSPITAL_COMMUNITY)
Admission: EM | Admit: 2021-08-10 | Discharge: 2021-08-10 | Disposition: A | Discharge status: Home / Self Care | Payer: Medicaid Other | Attending: Internal Medicine | Admitting: Internal Medicine

## 2021-08-10 ENCOUNTER — Encounter (HOSPITAL_COMMUNITY): Payer: Self-pay | Admitting: Emergency Medicine

## 2021-08-10 DIAGNOSIS — Z113 Encounter for screening for infections with a predominantly sexual mode of transmission: Secondary | ICD-10-CM | POA: Insufficient documentation

## 2021-08-10 NOTE — Discharge Instructions (Signed)
Your STI testing is pending.  Do not participate in sexual intercourse until your STI testing is back.  If your testing is positive, avoid intercourse for 1 week during treatment.  You will receive a phone call if any of your results are positive and treatment at that time.

## 2021-08-10 NOTE — ED Provider Notes (Signed)
[facility]    CSN: [id number] Arrival date & time: 08/10/21  0857      History   Chief Complaint Chief Complaint  Patient presents with   STD screening     HPI Marco Rhodes is a 21 y.o. male.   Patient presents to urgent care for routine STI screening.  Patient states that he is single and has not had any recent new sexual partners or known exposures to STIs and simply would like to get checked.  He has been treated for chlamydia in the past but this was approximately 1 year ago.  He is asymptomatic at this time and denies any penile discharge, penile rash, urinary frequency, urgency, dysuria, hesitancy, fever/chills, nausea, vomiting, and dizziness.  Patient states that he recently had HIV and RPR testing that was nonreactive and declines offer for repeat testing today.     Past Medical History:  Diagnosis Date   Asthma     There are no problems to display for this patient.   History reviewed. No pertinent surgical history.     Home Medications    Prior to Admission medications   Not on File    Family History Family History  Problem Relation Age of Onset   Diabetes Mother     Social History Social History   Tobacco Use   Smoking status: Former    Types: Cigars   Smokeless tobacco: Never  Vaping Use   Vaping Use: Never used  Substance Use Topics   Alcohol use: Never   Drug use: Yes    Types: Marijuana    Comment: Marijuana use daily      Allergies   Patient has no known allergies.   Review of Systems Review of Systems Per HPI  Physical Exam Triage Vital Signs ED Triage Vitals  Enc Vitals Group     BP 08/10/21 1022 (!) 151/86     Pulse Rate 08/10/21 1022 66     Resp 08/10/21 1022 16     Temp 08/10/21 1022 97.9 F (36.6 C)     Temp Source 08/10/21 1022 Oral     SpO2 08/10/21 1022 98 %     Weight --      Height --      Head Circumference --      Peak Flow --      Pain Score 08/10/21 1025 0     Pain Loc --      Pain  Edu? --      Excl. in GC? --    No data found.  Updated Vital Signs BP (!) 151/86 (BP Location: Left Arm)   Pulse 66   Temp 97.9 F (36.6 C) (Oral)   Resp 16   SpO2 98%   Visual Acuity Right Eye Distance:   Left Eye Distance:   Bilateral Distance:    Right Eye Near:   Left Eye Near:    Bilateral Near:     Physical Exam Vitals and nursing note reviewed.  Constitutional:      Appearance: Normal appearance. He is not ill-appearing or toxic-appearing.     Comments: Very pleasant patient sitting on exam in position of comfort table in no acute distress.   HENT:     Head: Normocephalic and atraumatic.     Right Ear: Hearing and external ear normal.     Left Ear: Hearing and external ear normal.     Nose: Nose normal.     Mouth/Throat:     Lips:  Pink.     Mouth: Mucous membranes are moist.  Eyes:     General: Lids are normal. Vision grossly intact. Gaze aligned appropriately.     Extraocular Movements: Extraocular movements intact.     Conjunctiva/sclera: Conjunctivae normal.  Pulmonary:     Effort: Pulmonary effort is normal.  Abdominal:     Palpations: Abdomen is soft.  Musculoskeletal:     Cervical back: Neck supple.  Skin:    General: Skin is warm and dry.     Capillary Refill: Capillary refill takes less than 2 seconds.     Findings: No rash.  Neurological:     General: No focal deficit present.     Mental Status: He is alert and oriented to person, place, and time. Mental status is at baseline.     Cranial Nerves: No dysarthria or facial asymmetry.     Gait: Gait is intact.  Psychiatric:        Mood and Affect: Mood normal.        Speech: Speech normal.        Behavior: Behavior normal.        Thought Content: Thought content normal.        Judgment: Judgment normal.      UC Treatments / Results  Labs (all labs ordered are listed, but only abnormal results are displayed) Labs Reviewed  CYTOLOGY, (ORAL, ANAL, URETHRAL) ANCILLARY ONLY     EKG   Radiology No results found.  Procedures Procedures (including critical care time)  Medications Ordered in UC Medications - No data to display  Initial Impression / Assessment and Plan / UC Course  I have reviewed the triage vital signs and the nursing notes.  Pertinent labs & imaging results that were available during my care of the patient were reviewed by me and considered in my medical decision making (see chart for details).   1.  Encounter for screening examination of sexually transmitted illnesses STI labs pending.  Patient declines HIV and syphilis testing today.  Will notify patient of positive results and treat accordingly when labs come back.  Patient to avoid sexual intercourse until screening testing comes back.  Education provided regarding safe sexual practices and patient encouraged to use protection to prevent spread of STIs.   ED return precautions given.  Patient may follow-up with urgent care as needed.  Final Clinical Impressions(s) / UC Diagnoses   Final diagnoses:  Screening for STD (sexually transmitted disease)     Discharge Instructions      Your STI testing is pending.  Do not participate in sexual intercourse until your STI testing is back.  If your testing is positive, avoid intercourse for 1 week during treatment.  You will receive a phone call if any of your results are positive and treatment at that time.       ED Prescriptions   None    PDMP not reviewed this encounter.   Carlisle Beers, [place] 08/10/21 1141

## 2021-08-10 NOTE — ED Triage Notes (Signed)
Patient presents for STD screening today.   Patient denies any symptoms. Patient denies any known exposures.

## 2021-08-11 LAB — CYTOLOGY, (ORAL, ANAL, URETHRAL) ANCILLARY ONLY
Chlamydia: NEGATIVE
Comment: NEGATIVE
Comment: NEGATIVE
Comment: NORMAL
Neisseria Gonorrhea: NEGATIVE
Trichomonas: NEGATIVE

## 2021-08-26 ENCOUNTER — Encounter: Payer: Self-pay | Admitting: Medical-Surgical

## 2021-09-02 NOTE — Telephone Encounter (Signed)
LVM for patient to call back to get appointment scheduled. AMUCK

## 2021-09-04 ENCOUNTER — Ambulatory Visit (HOSPITAL_COMMUNITY): Payer: Medicaid Other

## 2021-09-08 ENCOUNTER — Ambulatory Visit (HOSPITAL_COMMUNITY): Payer: Medicaid Other

## 2021-09-20 ENCOUNTER — Ambulatory Visit: Payer: Medicaid Other

## 2021-09-23 ENCOUNTER — Ambulatory Visit (HOSPITAL_COMMUNITY): Payer: Medicaid Other

## 2021-09-30 ENCOUNTER — Encounter (HOSPITAL_COMMUNITY): Payer: Self-pay

## 2021-09-30 ENCOUNTER — Ambulatory Visit (HOSPITAL_COMMUNITY)
Admission: RE | Admit: 2021-09-30 | Discharge: 2021-09-30 | Disposition: A | Discharge status: Home / Self Care | Payer: Medicaid Other | Source: Ambulatory Visit | Attending: Emergency Medicine | Admitting: Emergency Medicine

## 2021-09-30 VITALS — BP 130/83 | HR 66 | Temp 98.4°F | Resp 14

## 2021-09-30 DIAGNOSIS — Z113 Encounter for screening for infections with a predominantly sexual mode of transmission: Secondary | ICD-10-CM | POA: Insufficient documentation

## 2021-09-30 DIAGNOSIS — R369 Urethral discharge, unspecified: Secondary | ICD-10-CM | POA: Diagnosis present

## 2021-09-30 NOTE — Discharge Instructions (Addendum)
Swab sent out to test again for STIs. If tests are again negative but you continue to have discharge recommend follow-up with a urologist for further evaluation.   [facility] [address] [place],  [place]  [address] [phone]

## 2021-09-30 NOTE — ED Triage Notes (Signed)
Pt reports was seen here in July for penile discharge and all STD test came back negative. Reports still having discharge and wants to be retested.

## 2021-09-30 NOTE — ED Provider Notes (Signed)
[facility]    CSN: [id number] Arrival date & time: 09/30/21  1543      History   Chief Complaint Chief Complaint  Patient presents with   Exposure to STD    Entered by patient    HPI Tanush Drees is a 21 y.o. male.   Patient presents requesting STI testing. He was seen here in July and had testing that all returned negative. He states since then he has been having persistent urethral discharge. He states it is grey in color. He denies any pain or discomfort including dysuria or abdominal or back pain. He denies any rash or visible skin irritation. The patient reports he has not had intercourse since he was seen and tested in July. He requests to get retested due to his ongoing discharge.   The history is provided by the patient.  Exposure to STD Pertinent negatives include no abdominal pain.    Past Medical History:  Diagnosis Date   Asthma     There are no problems to display for this patient.   History reviewed. No pertinent surgical history.     Home Medications    Prior to Admission medications   Not on File    Family History Family History  Problem Relation Age of Onset   Diabetes Mother     Social History Social History   Tobacco Use   Smoking status: Former    Types: Cigars   Smokeless tobacco: Never  Vaping Use   Vaping Use: Never used  Substance Use Topics   Alcohol use: Never   Drug use: Yes    Types: Marijuana    Comment: Marijuana use daily      Allergies   Patient has no known allergies.   Review of Systems Review of Systems  Constitutional:  Negative for fever.  Gastrointestinal:  Negative for abdominal pain, nausea and vomiting.  Genitourinary:  Positive for penile discharge. Negative for dysuria, flank pain, frequency, genital sores, hematuria, penile pain, penile swelling, scrotal swelling, testicular pain and urgency.  Musculoskeletal:  Negative for back pain.  Skin:  Negative for rash.     Physical  Exam Triage Vital Signs ED Triage Vitals [09/30/21 1554]  Enc Vitals Group     BP 130/83     Pulse Rate 66     Resp 14     Temp 98.4 F (36.9 C)     Temp Source Oral     SpO2 97 %     Weight      Height      Head Circumference      Peak Flow      Pain Score 0     Pain Loc      Pain Edu?      Excl. in GC?    No data found.  Updated Vital Signs BP 130/83 (BP Location: Right Arm)   Pulse 66   Temp 98.4 F (36.9 C) (Oral)   Resp 14   SpO2 97%   Visual Acuity Right Eye Distance:   Left Eye Distance:   Bilateral Distance:    Right Eye Near:   Left Eye Near:    Bilateral Near:     Physical Exam Vitals and nursing note reviewed.  Constitutional:      General: He is not in acute distress. Cardiovascular:     Rate and Rhythm: Normal rate and regular rhythm.     Heart sounds: Normal heart sounds.  Pulmonary:     Effort:  Pulmonary effort is normal.     Breath sounds: Normal breath sounds.  Abdominal:     Palpations: Abdomen is soft.     Tenderness: There is no abdominal tenderness. There is no right CVA tenderness, left CVA tenderness or guarding.  Genitourinary:    Comments: Pt declined Neurological:     Mental Status: He is alert.  Psychiatric:        Mood and Affect: Mood normal.      UC Treatments / Results  Labs (all labs ordered are listed, but only abnormal results are displayed) Labs Reviewed  CYTOLOGY, (ORAL, ANAL, URETHRAL) ANCILLARY ONLY    EKG   Radiology No results found.  Procedures Procedures (including critical care time)  Medications Ordered in UC Medications - No data to display  Initial Impression / Assessment and Plan / UC Course  I have reviewed the triage vital signs and the nursing notes.  Pertinent labs & imaging results that were available during my care of the patient were reviewed by me and considered in my medical decision making (see chart for details).     Retest GCT. Discussed w pt recommendation to see urology  for further evaluation if negative testing and continue to have discharge. Due to sx persistent since last negative test, pt reports no sexual intercourse since last test, and no dysuria will hold off on any empiric tx until results return.   E/M: 1 acute uncomplicated illness, 1 data (GCT), low risk   Final Clinical Impressions(s) / UC Diagnoses   Final diagnoses:  Penile discharge  Screen for STD (sexually transmitted disease)     Discharge Instructions      Swab sent out to test again for STIs. If tests are again negative but you continue to have discharge recommend follow-up with a urologist for further evaluation.   [facility] [address] [place],  [place]  [address] [phone]    ED Prescriptions   None    PDMP not reviewed this encounter.   Estanislado Pandy, [place] 09/30/21 1614

## 2021-10-01 ENCOUNTER — Telehealth (HOSPITAL_COMMUNITY): Payer: Self-pay | Admitting: Emergency Medicine

## 2021-10-01 LAB — CYTOLOGY, (ORAL, ANAL, URETHRAL) ANCILLARY ONLY
Chlamydia: POSITIVE — AB
Comment: NEGATIVE
Comment: NEGATIVE
Comment: NORMAL
Neisseria Gonorrhea: NEGATIVE
Trichomonas: NEGATIVE

## 2021-10-01 MED ORDER — DOXYCYCLINE HYCLATE 100 MG PO CAPS: 100.0000 mg | ORAL_CAPSULE | Freq: Two times a day (BID) | ORAL | 0 refills | Status: AC

## 2022-01-15 ENCOUNTER — Encounter (HOSPITAL_COMMUNITY): Payer: Self-pay | Admitting: Emergency Medicine

## 2022-01-15 ENCOUNTER — Other Ambulatory Visit: Payer: Self-pay

## 2022-01-15 ENCOUNTER — Emergency Department (HOSPITAL_COMMUNITY)
Admission: EM | Admit: 2022-01-15 | Discharge: 2022-01-15 | Discharge status: Against Medical Advice | Payer: Medicaid Other | Attending: Emergency Medicine | Admitting: Emergency Medicine

## 2022-01-15 ENCOUNTER — Ambulatory Visit (HOSPITAL_COMMUNITY)
Admission: EM | Admit: 2022-01-15 | Discharge: 2022-01-15 | Disposition: A | Discharge status: Home / Self Care | Payer: Medicaid Other | Attending: Internal Medicine | Admitting: Internal Medicine

## 2022-01-15 ENCOUNTER — Emergency Department (HOSPITAL_COMMUNITY): Payer: Medicaid Other

## 2022-01-15 DIAGNOSIS — S51811A Laceration without foreign body of right forearm, initial encounter: Secondary | ICD-10-CM

## 2022-01-15 DIAGNOSIS — S61210A Laceration without foreign body of right index finger without damage to nail, initial encounter: Secondary | ICD-10-CM | POA: Diagnosis not present

## 2022-01-15 DIAGNOSIS — W268XXA Contact with other sharp object(s), not elsewhere classified, initial encounter: Secondary | ICD-10-CM | POA: Insufficient documentation

## 2022-01-15 DIAGNOSIS — S61411A Laceration without foreign body of right hand, initial encounter: Secondary | ICD-10-CM

## 2022-01-15 DIAGNOSIS — S41111A Laceration without foreign body of right upper arm, initial encounter: Secondary | ICD-10-CM | POA: Insufficient documentation

## 2022-01-15 DIAGNOSIS — Z5321 Procedure and treatment not carried out due to patient leaving prior to being seen by health care provider: Secondary | ICD-10-CM | POA: Insufficient documentation

## 2022-01-15 MED ORDER — IBUPROFEN 800 MG PO TABS
ORAL_TABLET | ORAL | Status: AC
  Filled 2022-01-15: qty 1

## 2022-01-15 MED ORDER — LIDOCAINE-EPINEPHRINE-TETRACAINE (LET) TOPICAL GEL
TOPICAL | Status: AC
  Filled 2022-01-15: qty 6

## 2022-01-15 MED ORDER — LIDOCAINE HCL (PF) 2 % IJ SOLN
INTRAMUSCULAR | Status: AC
  Filled 2022-01-15: qty 5

## 2022-01-15 MED ORDER — IBUPROFEN 800 MG PO TABS
800.0000 mg | ORAL_TABLET | Freq: Once | ORAL | Status: AC
  Administered 2022-01-15: 800 mg via ORAL

## 2022-01-15 MED ORDER — LIDOCAINE-EPINEPHRINE 1 %-1:100000 IJ SOLN
INTRAMUSCULAR | Status: AC
  Filled 2022-01-15: qty 1

## 2022-01-15 NOTE — ED Notes (Signed)
Pt stated he is leaving and going to urgent care

## 2022-01-15 NOTE — ED Provider Triage Note (Addendum)
Emergency Medicine Provider Triage Evaluation Note  Marco Rhodes , a 21 y.o. male  was evaluated in triage.  Pt complains of laceration to right arm.  Patient notes "I got gashed up.".  When asked questions about how the accident happened and when it had been patient responds "it is obvious that happened before I got here".  Patient not forthcoming about answers regarding tetanus status or any additional findings.  Patient only noting "I cannot feel my hand".  Patient is laying in a contorted position in triage.  Review of Systems  Positive:  Negative:   Physical Exam  BP 112/77 (BP Location: Left Arm)   Pulse (!) 104   Temp 97.8 F (36.6 C) (Oral)   Resp 19   SpO2 98%  Gen:   Awake, no distress   Resp:  Normal effort  MSK:   Moves extremities without difficulty  Other:  Attempted to locate patient's laceration, however, unable to assess patient's laceration due to patient not allowing myself, tech, RN to look at the laceration.    Medical Decision Making  Medically screening exam initiated at 8:21 AM.  Appropriate orders placed.  Anastasios Melander was informed that the remainder of the evaluation will be completed by another provider, this initial triage assessment does not replace that evaluation, and the importance of remaining in the ED until their evaluation is complete.  Xray ordered in light of patient not letting us review the laceration.   Press Casale A, PA-C 01/15/22 0822    Contrina Orona A, PA-C 01/15/22 [phone]

## 2022-01-15 NOTE — Discharge Instructions (Signed)
Wound care: Please keep the area surrounding the wound/sutures clean and dry for the next 24 hours. After 24 hours, you may get the wound wet. Gently clean wound with antibacterial soap. Do not scrub wound. Cover the area with a nonstick bandage and change the bandage 2 times a day.   You should have the sutures removed in 7-10 days by your primary care provider or at urgent care. Return sooner than 7-10 days if you experience discharge from your laceration, redness around your laceration, warmth around your laceration, or fever.   We recommend you take 600mg ibuprofen every 6 hours or tylenol 650mg every 6 hours as needed for pain. If needed, you can alternate these medications so that you take one medication every 3 hours. For instance, at noon take ibuprofen, then at 3pm take tylenol, then at 6pm take ibuprofen.  
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
## 2022-01-15 NOTE — ED Triage Notes (Signed)
Patient accidentally cut his R lower arm. Patient very verbally aggressive and not allowing this RN or the PA to assess the laceration. States that it happened accidentally. Unsure of tetanus.
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
## 2022-07-13 ENCOUNTER — Other Ambulatory Visit: Payer: Self-pay

## 2022-07-13 ENCOUNTER — Encounter (HOSPITAL_COMMUNITY): Payer: Self-pay

## 2022-07-13 ENCOUNTER — Ambulatory Visit (HOSPITAL_COMMUNITY)
Admission: RE | Admit: 2022-07-13 | Discharge: 2022-07-13 | Disposition: A | Discharge status: Home / Self Care | Payer: Medicaid Other | Source: Ambulatory Visit | Attending: Family Medicine | Admitting: Family Medicine

## 2022-07-13 VITALS — BP 129/83 | HR 90 | Temp 98.0°F | Resp 16 | Ht 73.0 in | Wt 176.4 lb

## 2022-07-13 DIAGNOSIS — N342 Other urethritis: Secondary | ICD-10-CM | POA: Diagnosis present

## 2022-07-13 MED ORDER — DOXYCYCLINE HYCLATE 100 MG PO CAPS: 100.0000 mg | ORAL_CAPSULE | Freq: Two times a day (BID) | ORAL | 0 refills | Status: AC

## 2022-07-13 MED ORDER — CEFTRIAXONE SODIUM 500 MG IJ SOLR
500.0000 mg | INTRAMUSCULAR | Status: DC
  Administered 2022-07-13: 500 mg via INTRAMUSCULAR

## 2022-07-13 MED ORDER — CEFTRIAXONE SODIUM 500 MG IJ SOLR
INTRAMUSCULAR | Status: AC
  Filled 2022-07-13: qty 500

## 2022-07-13 MED ORDER — LIDOCAINE HCL (PF) 1 % IJ SOLN
INTRAMUSCULAR | Status: AC
  Filled 2022-07-13: qty 2

## 2022-07-13 NOTE — ED Triage Notes (Signed)
Pt requesting to be check for STD. 

## 2022-07-13 NOTE — ED Provider Notes (Signed)
[facility]    CSN: [id number] Arrival date & time: 07/13/22  1846      History   Chief Complaint Chief Complaint  Patient presents with   SEXUALLY TRANSMITTED DISEASE    Unprotected sex - Entered by patient    HPI Marco Rhodes is a 22 y.o. male.   HPI Here for testing for sexually transmitted diseases.  He then relates that he has had unprotected sex and has had some penile discharge.  He is unable to tell me for how long.  No fever and no vomiting.  No allergies to medications    Past Medical History:  Diagnosis Date   Asthma     There are no problems to display for this patient.   History reviewed. No pertinent surgical history.     Home Medications    Prior to Admission medications   Medication Sig Start Date End Date Taking? Authorizing Provider  doxycycline (VIBRAMYCIN) 100 MG capsule Take 1 capsule (100 mg total) by mouth 2 (two) times daily for 7 days. 07/13/22 07/20/22 Yes Mylene Bow, Janace Aris, MD    Family History Family History  Problem Relation Age of Onset   Diabetes Mother     Social History Social History   Tobacco Use   Smoking status: Former    Types: Cigars   Smokeless tobacco: Never  Vaping Use   Vaping Use: Never used  Substance Use Topics   Alcohol use: Never   Drug use: Yes    Types: Marijuana    Comment: Marijuana use daily      Allergies   Patient has no known allergies.   Review of Systems Review of Systems   Physical Exam Triage Vital Signs ED Triage Vitals [07/13/22 1855]  Enc Vitals Group     BP 129/83     Pulse Rate 90     Resp 16     Temp 98 F (36.7 C)     Temp Source Oral     SpO2 100 %     Weight 176 lb 5.9 oz (80 kg)     Height 6\' 1"  (1.854 m)     Head Circumference      Peak Flow      Pain Score 0     Pain Loc      Pain Edu?      Excl. in GC?    No data found.  Updated Vital Signs BP 129/83 (BP Location: Right Arm)   Pulse 90   Temp 98 F (36.7 C) (Oral)   Resp 16   Ht  6\' 1"  (1.854 m)   Wt 80 kg   SpO2 100%   BMI 23.27 kg/m   Visual Acuity Right Eye Distance:   Left Eye Distance:   Bilateral Distance:    Right Eye Near:   Left Eye Near:    Bilateral Near:     Physical Exam Vitals reviewed.  Constitutional:      General: He is not in acute distress.    Appearance: He is not ill-appearing, toxic-appearing or diaphoretic.  Skin:    Coloration: Skin is not pale.  Neurological:     Mental Status: He is alert and oriented to person, place, and time.  Psychiatric:        Behavior: Behavior normal.      UC Treatments / Results  Labs (all labs ordered are listed, but only abnormal results are displayed) Labs Reviewed  CYTOLOGY, (ORAL, ANAL, URETHRAL) ANCILLARY ONLY  EKG   Radiology No results found.  Procedures Procedures (including critical care time)  Medications Ordered in UC Medications  cefTRIAXone (ROCEPHIN) injection 500 mg (has no administration in time range)    Initial Impression / Assessment and Plan / UC Course  I have reviewed the triage vital signs and the nursing notes.  Pertinent labs & imaging results that were available during my care of the patient were reviewed by me and considered in my medical decision making (see chart for details).        Urethral self swab is done tonight.  We will notify him of any positives.  He wants to be treated empirically for gonorrhea and chlamydia.  Ceftriaxone was given here IM and doxycycline is sent to the pharmacy.  Trichomonas is positive on the test, we will treat per protocol.  He declines my offer of HIV and RPR screening tonight. Final Clinical Impressions(s) / UC Diagnoses   Final diagnoses:  Urethritis     Discharge Instructions      Staff will notify you if anything is positive on your swab.  You have been given a shot of ceftriaxone 500 mg; this is before potential gonorrhea infection  Take doxycycline 100 mg --1 capsule 2 times daily for 7 days;  this is for potential chlamydia infection      ED Prescriptions     Medication Sig Dispense Auth. Provider   doxycycline (VIBRAMYCIN) 100 MG capsule Take 1 capsule (100 mg total) by mouth 2 (two) times daily for 7 days. 14 capsule Marlinda Mike, Janace Aris, MD      PDMP not reviewed this encounter.   Zenia Resides, MD 07/13/22 [phone]

## 2022-07-13 NOTE — Discharge Instructions (Signed)
Staff will notify you if anything is positive on your swab.  You have been given a shot of ceftriaxone 500 mg; this is before potential gonorrhea infection  Take doxycycline 100 mg --1 capsule 2 times daily for 7 days; this is for potential chlamydia infection

## 2022-07-15 ENCOUNTER — Telehealth (HOSPITAL_COMMUNITY): Payer: Self-pay | Admitting: Emergency Medicine

## 2022-07-15 LAB — CYTOLOGY, (ORAL, ANAL, URETHRAL) ANCILLARY ONLY
Chlamydia: POSITIVE — AB
Comment: NEGATIVE
Comment: NEGATIVE
Comment: NORMAL
Neisseria Gonorrhea: NEGATIVE
Trichomonas: POSITIVE — AB

## 2022-07-15 MED ORDER — METRONIDAZOLE 500 MG PO TABS: 2000.0000 mg | ORAL_TABLET | Freq: Once | ORAL | 0 refills | Status: AC
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
## 2022-08-12 ENCOUNTER — Ambulatory Visit (HOSPITAL_COMMUNITY): Payer: Medicaid Other

## 2022-08-20 ENCOUNTER — Encounter (HOSPITAL_COMMUNITY): Payer: Self-pay

## 2022-08-20 ENCOUNTER — Ambulatory Visit (HOSPITAL_COMMUNITY): Payer: Medicaid Other

## 2022-08-20 VITALS — BP 144/82 | HR 88 | Temp 98.4°F | Resp 16

## 2022-08-20 DIAGNOSIS — Z113 Encounter for screening for infections with a predominantly sexual mode of transmission: Secondary | ICD-10-CM | POA: Diagnosis present

## 2022-08-20 NOTE — Discharge Instructions (Signed)
If your results turn out positive, you will be contacted and treated at that time.  You need to avoid sexual activity while you are waiting on your results.  Always use protection.

## 2022-08-20 NOTE — ED Triage Notes (Signed)
Patient is requesting STD testing and denies having any symptoms,. "I'm just trying to be sure."

## 2022-08-20 NOTE — ED Provider Notes (Signed)
  [facility]   MRN: [id number] DOB: 06-Jul-2000  Subjective:   Marco Rhodes is a 22 y.o. male presenting for STD evaluation.  States that he has been sexually active recently with a male and did not use condoms.  He has a known history of STDs including chlamydia and trichomonas, which have been treated in the past.  He denies any symptoms including no penile discharge, abdominal pain, fever, lesions or sores.  No current facility-administered medications for this encounter. No current outpatient medications on file.   No Known Allergies  Past Medical History:  Diagnosis Date   Asthma      History reviewed. No pertinent surgical history.  Family History  Problem Relation Age of Onset   Diabetes Mother     Social History   Tobacco Use   Smoking status: Former    Types: Cigars   Smokeless tobacco: Never  Vaping Use   Vaping status: Never Used  Substance Use Topics   Alcohol use: Never   Drug use: Yes    Types: Marijuana    Comment: Marijuana use daily     ROS REFER TO HPI FOR PERTINENT POSITIVES AND NEGATIVES   Objective:   Vitals: BP (!) 144/82 (BP Location: Left Arm)   Pulse 88   Temp 98.4 F (36.9 C) (Oral)   Resp 16   SpO2 96%   Physical Exam Vitals and nursing note reviewed.  Constitutional:      Appearance: Normal appearance.  Eyes:     Extraocular Movements: Extraocular movements intact.     Conjunctiva/sclera: Conjunctivae normal.     Pupils: Pupils are equal, round, and reactive to light.  Cardiovascular:     Rate and Rhythm: Normal rate and regular rhythm.  Pulmonary:     Effort: Pulmonary effort is normal.     Breath sounds: Normal breath sounds.  Neurological:     Mental Status: He is alert and oriented to person, place, and time.  Psychiatric:        Mood and Affect: Mood normal.     No results found for this or any previous visit (from the past 24 hour(s)).  Assessment and Plan :   PDMP not reviewed this  encounter.  1. Encounter for screening examination for sexually transmitted disease    Patient tested for gonorrhea, chlamydia, trichomonas today.  Pending results, will treat accordingly.  He declined HIV and syphilis testing.  Advised avoiding sex until results are back.  Advised that he always use condoms.    AllwardtCrist Infante, PA-C 08/20/22 1740

## 2022-08-22 LAB — CYTOLOGY, (ORAL, ANAL, URETHRAL) ANCILLARY ONLY
Chlamydia: NEGATIVE
Comment: NEGATIVE
Comment: NEGATIVE
Comment: NORMAL
Neisseria Gonorrhea: NEGATIVE
Trichomonas: NEGATIVE

## 2022-10-29 ENCOUNTER — Ambulatory Visit (HOSPITAL_COMMUNITY): Payer: Medicaid Other

## 2022-10-31 ENCOUNTER — Ambulatory Visit (HOSPITAL_COMMUNITY): Payer: Medicaid Other

## 2022-11-01 ENCOUNTER — Ambulatory Visit (HOSPITAL_COMMUNITY)
Admission: EM | Admit: 2022-11-01 | Discharge: 2022-11-01 | Disposition: A | Discharge status: Home / Self Care | Payer: Medicaid Other | Attending: Emergency Medicine | Admitting: Emergency Medicine

## 2022-11-01 ENCOUNTER — Encounter (HOSPITAL_COMMUNITY): Payer: Self-pay | Admitting: Emergency Medicine

## 2022-11-01 DIAGNOSIS — R369 Urethral discharge, unspecified: Secondary | ICD-10-CM | POA: Diagnosis not present

## 2022-11-01 NOTE — ED Provider Notes (Signed)
[facility]    CSN: [id number] Arrival date & time: 11/01/22  1927      History   Chief Complaint Chief Complaint  Patient presents with   Penile Discharge    HPI Marco Rhodes is a 22 y.o. male.   Patient presents with penile discharge since Saturday.  Patient denies blood in urine, fever, abdominal pain, back pain, pelvic pain, and genital lesions.  Denies any known exposures.   Penile Discharge Pertinent negatives include no abdominal pain.    Past Medical History:  Diagnosis Date   Asthma     There are no problems to display for this patient.   History reviewed. No pertinent surgical history.     Home Medications    Prior to Admission medications   Not on File    Family History Family History  Problem Relation Age of Onset   Diabetes Mother     Social History Social History   Tobacco Use   Smoking status: Former    Types: Cigars   Smokeless tobacco: Never  Vaping Use   Vaping status: Never Used  Substance Use Topics   Alcohol use: Never   Drug use: Yes    Types: Marijuana    Comment: Marijuana use daily      Allergies   Patient has no known allergies.   Review of Systems Review of Systems  Constitutional:  Negative for fever.  Gastrointestinal:  Negative for abdominal pain.  Genitourinary:  Positive for dysuria and penile discharge. Negative for flank pain, frequency, genital sores, hematuria, penile pain, penile swelling, scrotal swelling, testicular pain and urgency.     Physical Exam Triage Vital Signs ED Triage Vitals  Encounter Vitals Group     BP 11/01/22 1952 113/75     Systolic BP Percentile --      Diastolic BP Percentile --      Pulse Rate 11/01/22 1952 81     Resp 11/01/22 1952 13     Temp 11/01/22 1952 98.5 F (36.9 C)     Temp Source 11/01/22 1952 Oral     SpO2 11/01/22 1952 97 %     Weight --      Height --      Head Circumference --      Peak Flow --      Pain Score 11/01/22 1951 0     Pain  Loc --      Pain Education --      Exclude from Growth Chart --    No data found.  Updated Vital Signs BP 113/75 (BP Location: Left Arm)   Pulse 81   Temp 98.5 F (36.9 C) (Oral)   Resp 13   SpO2 97%   Visual Acuity Right Eye Distance:   Left Eye Distance:   Bilateral Distance:    Right Eye Near:   Left Eye Near:    Bilateral Near:     Physical Exam Vitals and nursing note reviewed.  Constitutional:      General: He is awake. He is not in acute distress.    Appearance: Normal appearance. He is well-developed and well-groomed. He is not ill-appearing, toxic-appearing or diaphoretic.  Genitourinary:    Comments: GU exam deferred. Neurological:     Mental Status: He is alert.  Psychiatric:        Behavior: Behavior is cooperative.      UC Treatments / Results  Labs (all labs ordered are listed, but only abnormal results are displayed) Labs Reviewed  CYTOLOGY, (ORAL, ANAL, URETHRAL) ANCILLARY ONLY    EKG   Radiology No results found.  Procedures Procedures (including critical care time)  Medications Ordered in UC Medications - No data to display  Initial Impression / Assessment and Plan / UC Course  I have reviewed the triage vital signs and the nursing notes.  Pertinent labs & imaging results that were available during my care of the patient were reviewed by me and considered in my medical decision making (see chart for details).     Patient presented with penile discharge and Saturday.  Denies any known exposures.  GU exam deferred.  Patient performed self swab STD.  Declines HIV and syphilis testing at this time.  Discussed follow-up and return precautions. Final Clinical Impressions(s) / UC Diagnoses   Final diagnoses:  Penile discharge     Discharge Instructions      Your results will come back in the next few days and someone will call you if results are positive and you require treatment.  Return here as needed.    ED Prescriptions    None    PDMP not reviewed this encounter.   Wynonia Lawman A, NP 11/01/22 2017

## 2022-11-01 NOTE — ED Triage Notes (Signed)
Pt having penile discharge since Friday/Saturday.

## 2022-11-01 NOTE — Discharge Instructions (Signed)
Your results will come back in the next few days and someone will call you if results are positive and you require treatment.  Return here as needed.

## 2022-11-02 LAB — CYTOLOGY, (ORAL, ANAL, URETHRAL) ANCILLARY ONLY
Chlamydia: NEGATIVE
Comment: NEGATIVE
Comment: NEGATIVE
Comment: NORMAL
Neisseria Gonorrhea: NEGATIVE
Trichomonas: POSITIVE — AB

## 2022-11-03 ENCOUNTER — Telehealth: Payer: Self-pay

## 2022-11-03 MED ORDER — METRONIDAZOLE 500 MG PO TABS: 2000.0000 mg | ORAL_TABLET | Freq: Once | ORAL | 0 refills | Status: AC

## 2022-11-16 ENCOUNTER — Ambulatory Visit (HOSPITAL_COMMUNITY): Payer: Medicaid Other

## 2022-11-18 ENCOUNTER — Encounter (HOSPITAL_COMMUNITY): Payer: Self-pay

## 2022-11-18 ENCOUNTER — Ambulatory Visit (HOSPITAL_COMMUNITY)
Admission: RE | Admit: 2022-11-18 | Discharge: 2022-11-18 | Disposition: A | Discharge status: Home / Self Care | Payer: Medicaid Other | Source: Ambulatory Visit | Attending: Internal Medicine | Admitting: Internal Medicine

## 2022-11-18 VITALS — BP 111/71 | HR 64 | Temp 99.0°F | Resp 14

## 2022-11-18 DIAGNOSIS — R369 Urethral discharge, unspecified: Secondary | ICD-10-CM | POA: Diagnosis present

## 2022-11-18 LAB — POCT URINALYSIS DIP (MANUAL ENTRY)
Bilirubin, UA: NEGATIVE
Blood, UA: NEGATIVE
Glucose, UA: NEGATIVE mg/dL
Ketones, POC UA: NEGATIVE mg/dL
Leukocytes, UA: NEGATIVE
Nitrite, UA: NEGATIVE
Protein Ur, POC: NEGATIVE mg/dL
Spec Grav, UA: 1.025 (ref 1.010–1.025)
Urobilinogen, UA: 1 U/dL
pH, UA: 7 (ref 5.0–8.0)

## 2022-11-18 MED ORDER — DOXYCYCLINE HYCLATE 100 MG PO CAPS: 100.0000 mg | ORAL_CAPSULE | Freq: Two times a day (BID) | ORAL | 0 refills | Status: DC

## 2022-11-18 NOTE — ED Provider Notes (Signed)
[facility]    CSN: [id number] Arrival date & time: 11/18/22  1736      History   Chief Complaint Chief Complaint  Patient presents with   SEXUALLY TRANSMITTED DISEASE    Entered by patient    HPI Marco Rhodes is a 22 y.o. male who presents due to having clear penile discharge x 2-3 days and little dysuria. He has had unprotected sex 7 days ago and symptoms started 3 days later. Has had Chlamydia in the past and his symptoms are similar.     Past Medical History:  Diagnosis Date   Asthma     There are no problems to display for this patient.   History reviewed. No pertinent surgical history.     Home Medications    Prior to Admission medications   Medication Sig Start Date End Date Taking? Authorizing Provider  doxycycline (VIBRAMYCIN) 100 MG capsule Take 1 capsule (100 mg total) by mouth 2 (two) times daily. 11/18/22  Yes Rodriguez-Southworth, Nettie Elm, PA-C    Family History Family History  Problem Relation Age of Onset   Diabetes Mother     Social History Social History   Tobacco Use   Smoking status: Former    Types: Cigars   Smokeless tobacco: Never  Vaping Use   Vaping status: Some Days   Substances: Nicotine, Flavoring  Substance Use Topics   Alcohol use: Never   Drug use: Yes    Types: Marijuana    Comment: Marijuana use daily      Allergies   Patient has no known allergies.   Review of Systems Review of Systems As noted in HPI  Physical Exam Triage Vital Signs ED Triage Vitals  Encounter Vitals Group     BP 11/18/22 1801 111/71     Systolic BP Percentile --      Diastolic BP Percentile --      Pulse Rate 11/18/22 1801 64     Resp 11/18/22 1801 14     Temp 11/18/22 1801 99 F (37.2 C)     Temp Source 11/18/22 1801 Oral     SpO2 11/18/22 1801 97 %     Weight --      Height --      Head Circumference --      Peak Flow --      Pain Score 11/18/22 1802 0     Pain Loc --      Pain Education --      Exclude from  Growth Chart --    No data found.  Updated Vital Signs BP 111/71 (BP Location: Right Arm)   Pulse 64   Temp 99 F (37.2 C) (Oral)   Resp 14   SpO2 97%   Visual Acuity Right Eye Distance:   Left Eye Distance:   Bilateral Distance:    Right Eye Near:   Left Eye Near:    Bilateral Near:     Physical Exam Vitals reviewed.  Constitutional:      General: He is not in acute distress.    Appearance: He is not toxic-appearing.  HENT:     Right Ear: External ear normal.     Left Ear: External ear normal.  Eyes:     Conjunctiva/sclera: Conjunctivae normal.  Pulmonary:     Effort: Pulmonary effort is normal.  Musculoskeletal:        General: Normal range of motion.  Neurological:     Mental Status: He is alert and oriented to person,  place, and time.     Gait: Gait normal.  Psychiatric:        Mood and Affect: Mood normal.        Behavior: Behavior normal.        Thought Content: Thought content normal.        Judgment: Judgment normal.      UC Treatments / Results  Labs (all labs ordered are listed, but only abnormal results are displayed) Labs Reviewed  POCT URINALYSIS DIP (MANUAL ENTRY)  CYTOLOGY, (ORAL, ANAL, URETHRAL) ANCILLARY ONLY   UA negative EKG   Radiology No results found.  Procedures Procedures (including critical care time)  Medications Ordered in UC Medications - No data to display  Initial Impression / Assessment and Plan / UC Course  I have reviewed the triage vital signs and the nursing notes.  Pertinent labs  results that were available during my care of the patient were reviewed by me and considered in my medical decision making (see chart for details).  Penile discharge STD screen  GC/Chlamydia tests ordered and we will inform him of the results when they come back.  In the mean time I placed him on Doxy as noted.     Final Clinical Impressions(s) / UC Diagnoses   Final diagnoses:  Penile discharge     Discharge  Instructions      We will call you if your STD results are positive If they come back negative and you still have a discharge, then follow up with a urologist.      ED Prescriptions     Medication Sig Dispense Auth. Provider   doxycycline (VIBRAMYCIN) 100 MG capsule Take 1 capsule (100 mg total) by mouth 2 (two) times daily. 20 capsule Rodriguez-Southworth, Nettie Elm, PA-C      PDMP not reviewed this encounter.   Garey Ham, [place] 11/18/22 1843

## 2022-11-18 NOTE — Discharge Instructions (Signed)
We will call you if your STD results are positive If they come back negative and you still have a discharge, then follow up with a urologist.

## 2022-11-18 NOTE — ED Triage Notes (Signed)
Patient reports that he is having a clear penile discharge x 2-3 days. Patient states he has "a little bit of burning when he urinates."

## 2022-11-22 LAB — CYTOLOGY, (ORAL, ANAL, URETHRAL) ANCILLARY ONLY
Chlamydia: NEGATIVE
Comment: NEGATIVE
Comment: NORMAL
Neisseria Gonorrhea: NEGATIVE

## 2022-12-29 ENCOUNTER — Ambulatory Visit (HOSPITAL_COMMUNITY): Payer: Medicaid Other

## 2023-01-06 ENCOUNTER — Ambulatory Visit (HOSPITAL_COMMUNITY): Payer: Medicaid Other

## 2023-03-31 ENCOUNTER — Ambulatory Visit

## 2023-04-01 ENCOUNTER — Ambulatory Visit

## 2023-04-10 ENCOUNTER — Ambulatory Visit
Admission: RE | Admit: 2023-04-10 | Discharge: 2023-04-10 | Disposition: A | Discharge status: Home / Self Care | Source: Ambulatory Visit | Attending: Internal Medicine | Admitting: Internal Medicine

## 2023-04-10 ENCOUNTER — Other Ambulatory Visit: Payer: Self-pay

## 2023-04-10 ENCOUNTER — Ambulatory Visit
Admission: RE | Admit: 2023-04-10 | Discharge: 2023-04-10 | Disposition: A | Discharge status: Home / Self Care | Source: Ambulatory Visit

## 2023-04-10 ENCOUNTER — Encounter: Payer: Self-pay | Admitting: *Deleted

## 2023-04-10 DIAGNOSIS — Z113 Encounter for screening for infections with a predominantly sexual mode of transmission: Secondary | ICD-10-CM | POA: Diagnosis present

## 2023-04-10 NOTE — Discharge Instructions (Signed)
 STD testing pending.  We will call if it is positive.

## 2023-04-10 NOTE — ED Triage Notes (Signed)
 Pt reports possible exposure to STI- requests testing

## 2023-04-10 NOTE — ED Provider Notes (Signed)
 [facility]    CSN: [id number] Arrival date & time: 04/10/23  1804      History   Chief Complaint Chief Complaint  Patient presents with   Exposure to STD    HPI Jalil Lorusso is a 23 y.o. male.   Patient presents today for routine STD testing.  Denies any exposure or symptoms.   Exposure to STD    Past Medical History:  Diagnosis Date   Asthma     There are no active problems to display for this patient.   History reviewed. No pertinent surgical history.     Home Medications    Prior to Admission medications   Medication Sig Start Date End Date Taking? Authorizing Provider  doxycycline (VIBRAMYCIN) 100 MG capsule Take 1 capsule (100 mg total) by mouth 2 (two) times daily. Patient not taking: Reported on 04/10/2023 11/18/22   Rodriguez-Southworth, Nettie Elm, PA-C    Family History Family History  Problem Relation Age of Onset   Diabetes Mother     Social History Social History   Tobacco Use   Smoking status: Former    Types: Cigars   Smokeless tobacco: Never  Vaping Use   Vaping status: Some Days   Substances: Nicotine, Flavoring  Substance Use Topics   Alcohol use: Never   Drug use: Yes    Types: Marijuana    Comment: occasional     Allergies   Patient has no known allergies.   Review of Systems Review of Systems Per HPI  Physical Exam Triage Vital Signs ED Triage Vitals  Encounter Vitals Group     BP 04/10/23 1906 133/83     Systolic BP Percentile --      Diastolic BP Percentile --      Pulse Rate 04/10/23 1906 66     Resp 04/10/23 1906 16     Temp 04/10/23 1906 98.6 F (37 C)     Temp Source 04/10/23 1906 Oral     SpO2 04/10/23 1906 97 %     Weight --      Height --      Head Circumference --      Peak Flow --      Pain Score 04/10/23 1903 0     Pain Loc --      Pain Education --      Exclude from Growth Chart --    No data found.  Updated Vital Signs BP 133/83 (BP Location: Left Arm)   Pulse 66   Temp  98.6 F (37 C) (Oral)   Resp 16   SpO2 97%   Visual Acuity Right Eye Distance:   Left Eye Distance:   Bilateral Distance:    Right Eye Near:   Left Eye Near:    Bilateral Near:     Physical Exam Constitutional:      General: He is not in acute distress.    Appearance: Normal appearance. He is not toxic-appearing or diaphoretic.  HENT:     Head: Normocephalic and atraumatic.  Eyes:     Extraocular Movements: Extraocular movements intact.     Conjunctiva/sclera: Conjunctivae normal.  Pulmonary:     Effort: Pulmonary effort is normal.  Genitourinary:    Comments: Deferred with shared decision making.  Self swab performed. Neurological:     General: No focal deficit present.     Mental Status: He is alert and oriented to person, place, and time. Mental status is at baseline.  Psychiatric:  Mood and Affect: Mood normal.        Behavior: Behavior normal.        Thought Content: Thought content normal.        Judgment: Judgment normal.      UC Treatments / Results  Labs (all labs ordered are listed, but only abnormal results are displayed) Labs Reviewed  CYTOLOGY, (ORAL, ANAL, URETHRAL) ANCILLARY ONLY    EKG   Radiology No results found.  Procedures Procedures (including critical care time)  Medications Ordered in UC Medications - No data to display  Initial Impression / Assessment and Plan / UC Course  I have reviewed the triage vital signs and the nursing notes.  Pertinent labs & imaging results that were available during my care of the patient were reviewed by me and considered in my medical decision making (see chart for details).     Patient here for routine STD screening.  Cytology swab pending.  Patient declined blood work for HIV and syphilis.  Advised strict follow-up precautions.  Patient verbalized understanding and was agreeable with plan. Final Clinical Impressions(s) / UC Diagnoses   Final diagnoses:  Screening examination for venereal  disease     Discharge Instructions      STD testing pending.  We will call if it is positive.    ED Prescriptions   None    PDMP not reviewed this encounter.   Gustavus Bryant, [place] 04/11/23 [phone]

## 2023-04-11 LAB — CYTOLOGY, (ORAL, ANAL, URETHRAL) ANCILLARY ONLY
Chlamydia: POSITIVE — AB
Comment: NEGATIVE
Comment: NEGATIVE
Comment: NORMAL
Neisseria Gonorrhea: NEGATIVE
Trichomonas: NEGATIVE

## 2023-04-12 ENCOUNTER — Telehealth (HOSPITAL_COMMUNITY): Payer: Self-pay

## 2023-04-12 MED ORDER — DOXYCYCLINE HYCLATE 100 MG PO TABS: 100.0000 mg | ORAL_TABLET | Freq: Two times a day (BID) | ORAL | 0 refills | Status: AC

## 2023-04-12 NOTE — Telephone Encounter (Signed)
 Per protocol, pt requires tx with Doxycycline.  Attempted to reach patient x1. LVM.  Rx sent to pharmacy on file.

## 2023-07-07 ENCOUNTER — Ambulatory Visit

## 2023-07-11 ENCOUNTER — Ambulatory Visit

## 2023-08-29 ENCOUNTER — Ambulatory Visit (HOSPITAL_COMMUNITY)

## 2023-12-03 ENCOUNTER — Encounter (HOSPITAL_COMMUNITY): Payer: Self-pay

## 2023-12-03 ENCOUNTER — Ambulatory Visit (HOSPITAL_COMMUNITY)
Admission: RE | Admit: 2023-12-03 | Discharge: 2023-12-03 | Disposition: A | Discharge status: Home / Self Care | Source: Ambulatory Visit | Attending: Internal Medicine | Admitting: Internal Medicine

## 2023-12-03 VITALS — BP 131/84 | HR 84 | Temp 97.9°F | Resp 14

## 2023-12-03 DIAGNOSIS — Z9189 Other specified personal risk factors, not elsewhere classified: Secondary | ICD-10-CM | POA: Insufficient documentation

## 2023-12-03 DIAGNOSIS — Z113 Encounter for screening for infections with a predominantly sexual mode of transmission: Secondary | ICD-10-CM | POA: Diagnosis present

## 2023-12-03 LAB — HIV ANTIBODY (ROUTINE TESTING W REFLEX): HIV Screen 4th Generation wRfx: NONREACTIVE
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
## 2023-12-03 NOTE — Discharge Instructions (Addendum)

## 2023-12-03 NOTE — ED Triage Notes (Signed)
 Pt requesting routine STD testing. Denies exposure or s/s.

## 2023-12-04 LAB — CYTOLOGY, (ORAL, ANAL, URETHRAL) ANCILLARY ONLY
Chlamydia: POSITIVE — AB
Comment: NEGATIVE
Comment: NEGATIVE
Comment: NORMAL
Neisseria Gonorrhea: NEGATIVE
Trichomonas: NEGATIVE

## 2023-12-04 LAB — RPR: RPR Ser Ql: NONREACTIVE

## 2023-12-05 ENCOUNTER — Ambulatory Visit (HOSPITAL_BASED_OUTPATIENT_CLINIC_OR_DEPARTMENT_OTHER): Payer: Self-pay

## 2023-12-07 MED ORDER — DOXYCYCLINE HYCLATE 100 MG PO TABS: 100.0000 mg | ORAL_TABLET | Freq: Two times a day (BID) | ORAL | 0 refills | Status: AC
